# Patient Record
Sex: Male | Born: 1985 | Race: Black or African American | Hispanic: No | Marital: Single | Smoking: Current every day smoker
Health system: Southern US, Community
[De-identification: ages and names within clinical notes are randomized; demographics above are authoritative.]

---

## 2007-12-07 ENCOUNTER — Emergency Department: Payer: Self-pay | Admitting: Emergency Medicine

## 2009-09-25 ENCOUNTER — Emergency Department: Payer: Self-pay | Admitting: Emergency Medicine

## 2010-04-19 ENCOUNTER — Emergency Department: Payer: Self-pay | Admitting: Emergency Medicine

## 2010-04-21 ENCOUNTER — Emergency Department: Payer: Self-pay | Admitting: Emergency Medicine

## 2015-11-30 ENCOUNTER — Emergency Department: Payer: Self-pay

## 2015-11-30 ENCOUNTER — Emergency Department
Admission: EM | Admit: 2015-11-30 | Discharge: 2015-11-30 | Disposition: A | Discharge status: Home / Self Care | Payer: Self-pay | Attending: Emergency Medicine | Admitting: Emergency Medicine

## 2015-11-30 ENCOUNTER — Encounter: Payer: Self-pay | Admitting: Emergency Medicine

## 2015-11-30 DIAGNOSIS — F17200 Nicotine dependence, unspecified, uncomplicated: Secondary | ICD-10-CM | POA: Insufficient documentation

## 2015-11-30 DIAGNOSIS — M5481 Occipital neuralgia: Secondary | ICD-10-CM | POA: Insufficient documentation

## 2015-11-30 DIAGNOSIS — R202 Paresthesia of skin: Secondary | ICD-10-CM

## 2015-11-30 NOTE — ED Provider Notes (Signed)
[facility] Emergency Department Provider Note  Time seen: 2:25 PM  I have reviewed the triage vital signs and the nursing notes.   HISTORY  Chief Complaint Numbness    HPI Alvie HeidelbergGeraldo R Beedle is a 30 y.o. male with no past medical history presents the emergency department with numbness to the back of his right scalp. According to the patient for approximately one week he's had a numbness/decreased sensation to the back of the right scalp. Denies any known injuries. Denies any neck pain. Denies any headache, focal weakness or numbness other than the scalp. Denies any confusion, slurred speech, trouble walking or talking.     History reviewed. No pertinent past medical history.  There are no active problems to display for this patient.   History reviewed. No pertinent past surgical history.  No current outpatient prescriptions on file.  Allergies Review of patient's allergies indicates not on file.  History reviewed. No pertinent family history.  Social History Social History  Substance Use Topics  . Smoking status: Current Every Day Smoker  . Smokeless tobacco: None  . Alcohol Use: Yes     Comment: mostly drinks on weekends ("a lot")    Review of Systems Constitutional: Negative for fever. Cardiovascular: Negative for chest pain. Respiratory: Negative for shortness of breath. Gastrointestinal: Negative for abdominal pain Musculoskeletal: Negative for back pain.Negative for neck pain. Neurological: Negative for . Decreased sensation to right occipital scalp. 10-point ROS otherwise negative.  ____________________________________________   PHYSICAL EXAM:  VITAL SIGNS: ED Triage Vitals  Enc Vitals Group     BP 11/30/15 1130 134/64 mmHg     Pulse Rate 11/30/15 1130 73     Resp 11/30/15 1130 20     Temp 11/30/15 1130 98.3 F (36.8 C)     Temp src --      SpO2 11/30/15 1130 95 %     Weight 11/30/15 1130 195 lb (88.451 kg)     Height  11/30/15 1130 5\' 9"  (1.753 m)     Head Cir --      Peak Flow --      Pain Score --      Pain Loc --      Pain Edu? --      Excl. in GC? --     Constitutional: Alert and oriented. Well appearing and in no distress. Eyes: Normal exam ENT   Head: Normocephalic and atraumatic.   Mouth/Throat: Mucous membranes are moist. Cardiovascular: Normal rate, regular rhythm. No murmur Respiratory: Normal respiratory effort without tachypnea nor retractions. Breath sounds are clear  Gastrointestinal: Soft and nontender. No distention.  Musculoskeletal: Nontender with normal range of motion in all extremities.  Neurologic:  Normal speech and language. Patient states decreased sensation to right occipital scalp, but he can feel me touching him there. Otherwise neurologic exam is normal. 5/5 motor in all extremities, equal grip strengths, no pronator drift. Cranial nerves intact. Skin:  Skin is warm, dry and intact. No rash identified. Psychiatric: Mood and affect are normal.   ____________________________________________     RADIOLOGY  CT head negative   INITIAL IMPRESSION / ASSESSMENT AND PLAN / ED COURSE  Pertinent labs & imaging results that were available during my care of the patient were reviewed by me and considered in my medical decision making (see chart for details).  Patient presents the emergency department with 1 week of right-sided occipital scalp numbness/decreased sensation. The numbness appears to be an occipital nerve distribution. Otherwise normal neurologic exam.  CT scan performed in triage is negative. I discussed with the patient ibuprofen/Motrin as needed as an anti-inflammatory, allowing 2-3 weeks for resolution of this likely occipital neuropathy. Patient agreeable to plan.  ____________________________________________   FINAL CLINICAL IMPRESSION(S) / ED DIAGNOSES  Occipital neuropathy   Minna Antis, MD 11/30/15 1427

## 2015-11-30 NOTE — ED Notes (Signed)
Says numbness on left posterior scalp since ?saturday.  No injury and no headache.  nad

## 2015-11-30 NOTE — ED Notes (Signed)
Pt reports numbness to back of head x 1 week. Denies pain, weakness, altered mental status. Pt alert & oriented with NAD noted.

## 2015-11-30 NOTE — Discharge Instructions (Signed)
Paresthesia Paresthesia is an abnormal burning or prickling sensation. This sensation is generally felt in the hands, arms, legs, or feet. However, it may occur in any part of the body. Usually, it is not painful. The feeling may be described as:  Tingling or numbness.  Pins and needles.  Skin crawling.  Buzzing.  Limbs falling asleep.  Itching. Most people experience temporary (transient) paresthesia at some time in their lives. Paresthesia may occur when you breathe too quickly (hyperventilation). It can also occur without any apparent cause. Commonly, paresthesia occurs when pressure is placed on a nerve. The sensation quickly goes away after the pressure is removed. For some people, however, paresthesia is a long-lasting (chronic) condition that is caused by an underlying disorder. If you continue to have paresthesia, you may need further medical evaluation. HOME CARE INSTRUCTIONS Watch your condition for any changes. Taking the following actions may help to lessen any discomfort that you are feeling:  Avoid drinking alcohol.  Try acupuncture or massage to help relieve your symptoms.  Keep all follow-up visits as directed by your health care provider. This is important. SEEK MEDICAL CARE IF:  You continue to have episodes of paresthesia.  Your burning or prickling feeling gets worse when you walk.  You have pain, cramps, or dizziness.  You develop a rash. SEEK IMMEDIATE MEDICAL CARE IF:  You feel weak.  You have trouble walking or moving.  You have problems with speech, understanding, or vision.  You feel confused.  You cannot control your bladder or bowel movements.  You have numbness after an injury.  You faint.   This information is not intended to replace advice given to you by your health care provider. Make sure you discuss any questions you have with your health care provider.   Document Released: 07/01/2002 Document Revised: 11/25/2014 Document Reviewed:  07/07/2014 Elsevier Interactive Patient Education 2016 Else[employer] Inc[employer]  Occipital Neuralgia Occipital neuralgia is a type of headache that causes episodes of very bad pain in the back of your head. Pain from occipital neuralgia may spread (radiate) to other parts of your head. The pain is usually brief and often goes away after you rest and relax. These headaches may be caused by irritation of the nerves that leave your spinal cord high up in your neck, just below the base of your skull (occipital nerves). Your occipital nerves transmit sensations from the back of your head, the top of your head, and the areas behind your ears. CAUSES Occipital neuralgia can occur without any known cause (primary headache syndrome). In other cases, occipital neuralgia is caused by pressure on or irritation of one of the two occipital nerves. Causes of occipital nerve compression or irritation include:  Wear and tear of the vertebrae in the neck (osteoarthritis).  Neck injury.  Disease of the disks that separate the vertebrae.  Tumors.  Gout.  Infections.  Diabetes.  Swollen blood vessels that put pressure on the occipital nerves.  Muscle spasm in the neck. SIGNS AND SYMPTOMS Pain is the main symptom of occipital neuralgia. It usually starts in the back of the head but may also be felt in other areas supplied by the occipital nerves. Pain is usually on one side but may be on both sides. You may have:   Brief episodes of very bad pain that is burning, stabbing, shocking, or shooting.  Pain behind the eye.  Pain triggered by neck movement or hair brushing.  Scalp tenderness.  Aching in the back of the head  between episodes of very bad pain. DIAGNOSIS  Your health care provider may diagnose occipital neuralgia based on your symptoms and a physical exam. During the exam, the health care provider may push on areas supplied by the occipital nerves to see if they are painful. Some tests may also be done  to help in making the diagnosis. These may include:  Imaging studies of the upper spinal cord, such as an MRI or CT scan. These may show compression or spinal cord abnormalities.  Nerve block. You will get an injection of numbing medicine (local anesthetic) near the occipital nerve to see if this relieves pain. TREATMENT  Treatment may begin with simple measures, such as:   Rest.  Massage.  Heat.  Over-the-counter pain relievers. If these measures do not work, you may need other treatments, including:  Medicines such as:  Prescription-strength anti-inflammatory medicines.  Muscle relaxants.  Antiseizure medicines.  Antidepressants.  Steroid injection. This involves injections of local anesthetic and strong anti-inflammatory drugs (steroids).  Pulsed radiofrequency. Wires are implanted to deliver electrical impulses that block pain signals from the occipital nerve.  Physical therapy.  Surgery to relieve nerve pressure. HOME CARE INSTRUCTIONS  Take all medicines as directed by your health care provider.  Avoid activities that cause pain.  Rest when you have an attack of pain.  Try gentle massage or a heating pad to relieve pain.  Work with a physical therapist to learn stretching exercises you can do at home.  Try a different pillow or sleeping position.  Practice good posture.  Try to stay active. Get regular exercise that does not cause pain. Ask your health care provider to suggest safe exercises for you.  Keep all follow-up visits as directed by your health care provider. This is important. SEEK MEDICAL CARE IF:  Your medicine is not working.  You have new or worsening symptoms. SEEK IMMEDIATE MEDICAL CARE IF:  You have very bad head pain that is not going away.  You have a sudden change in vision, balance, or speech. MAKE SURE YOU:  Understand these instructions.  Will watch your condition.  Will get help right away if you are not doing well or  get worse.   This information is not intended to replace advice given to you by your health care provider. Make sure you discuss any questions you have with your health care provider.   Document Released: 07/05/2001 Document Revised: 08/01/2014 Document Reviewed: 07/03/2013 Elsevier Interactive Patient Education [employer]

## 2016-12-30 ENCOUNTER — Emergency Department
Admission: EM | Admit: 2016-12-30 | Discharge: 2016-12-30 | Disposition: A | Discharge status: Home / Self Care | Payer: Worker's Compensation | Attending: Emergency Medicine | Admitting: Emergency Medicine

## 2016-12-30 ENCOUNTER — Emergency Department: Payer: Worker's Compensation

## 2016-12-30 DIAGNOSIS — Y998 Other external cause status: Secondary | ICD-10-CM | POA: Diagnosis not present

## 2016-12-30 DIAGNOSIS — Y9389 Activity, other specified: Secondary | ICD-10-CM | POA: Insufficient documentation

## 2016-12-30 DIAGNOSIS — S61234A Puncture wound without foreign body of right ring finger without damage to nail, initial encounter: Secondary | ICD-10-CM | POA: Insufficient documentation

## 2016-12-30 DIAGNOSIS — F17200 Nicotine dependence, unspecified, uncomplicated: Secondary | ICD-10-CM | POA: Diagnosis not present

## 2016-12-30 DIAGNOSIS — Y929 Unspecified place or not applicable: Secondary | ICD-10-CM | POA: Diagnosis not present

## 2016-12-30 DIAGNOSIS — W458XXA Other foreign body or object entering through skin, initial encounter: Secondary | ICD-10-CM | POA: Diagnosis not present

## 2016-12-30 DIAGNOSIS — Z23 Encounter for immunization: Secondary | ICD-10-CM | POA: Diagnosis not present

## 2016-12-30 DIAGNOSIS — S60944A Unspecified superficial injury of right ring finger, initial encounter: Secondary | ICD-10-CM | POA: Diagnosis present

## 2016-12-30 MED ORDER — PENTAFLUOROPROP-TETRAFLUOROETH EX AERO
INHALATION_SPRAY | CUTANEOUS | Status: AC
  Administered 2016-12-30: 20:00:00
  Filled 2016-12-30: qty 30

## 2016-12-30 MED ORDER — TETANUS-DIPHTH-ACELL PERTUSSIS 5-2.5-18.5 LF-MCG/0.5 IM SUSP
0.5000 mL | Freq: Once | INTRAMUSCULAR | Status: AC
  Administered 2016-12-30: 0.5 mL via INTRAMUSCULAR
  Filled 2016-12-30: qty 0.5

## 2016-12-30 NOTE — ED Provider Notes (Signed)
[facility] Emergency Department Provider Note  ____________________________________________  Time seen: Approximately 7:24 PM  I have reviewed the triage vital signs and the nursing notes.   HISTORY  Chief Complaint Finger Injury and Foreign Body in Skin   HPI Roy Foster is a 31 y.o. male who presents to the emergency department for evaluation of what he believes to be a splinter that is in his right hand, ring finger. He states that he was loading some equipment and pushed a log out of the way and immediately felt something stick into his finger. He has been unable to remove anything that feels take their is a splinter under the skin. He also reports that his tetanus shot is not up-to-date  History reviewed. No pertinent past medical history.  There are no active problems to display for this patient.   History reviewed. No pertinent surgical history.  Prior to Admission medications   Not on File    Allergies Patient has no known allergies.  No family history on file.  Social History Social History  Substance Use Topics  . Smoking status: Current Every Day Smoker  . Smokeless tobacco: Never Used  . Alcohol use Yes     Comment: mostly drinks on weekends ("a lot")    Review of Systems  Constitutional: Well appearing.  Respiratory: Negative for cough or shortness of breath  Musculoskeletal: Positive for ring finger pain on the right and  Skin: Puncture wound noted of the right hand, ring finger. Neurological: Negative for loss of sensation or paresthesias of the right hand. ____________________________________________   PHYSICAL EXAM:  VITAL SIGNS: ED Triage Vitals  Enc Vitals Group     BP 12/30/16 1919 123/75     Pulse Rate 12/30/16 1919 71     Resp 12/30/16 1919 16     Temp 12/30/16 1919 98.4 F (36.9 C)     Temp Source 12/30/16 1919 Oral     SpO2 12/30/16 1919 96 %     Weight 12/30/16 1915 210 lb (95.3 kg)     Height  12/30/16 1915 5\' 9"  (1.753 m)     Head Circumference --      Peak Flow --      Pain Score 12/30/16 1914 3     Pain Loc --      Pain Edu? --      Excl. in GC? --      Constitutional: Well appearing. Cardiovascular: Capillary refill is less than 3 seconds. Respiratory: Respirations are even and unlabored. Musculoskeletal: Full, active range of motion of the right hand, specifically the right ring finger. Neurologic: Motor and sensation intact Skin:  Puncture wound present to the finger pad of the right ring finger  ____________________________________________   LABS (all labs ordered are listed, but only abnormal results are displayed)  Labs Reviewed - No data to display ____________________________________________  EKG   ____________________________________________  RADIOLOGY  No retained foreign body or acute bony abnormality of the right ring finger per radiology. ____________________________________________   PROCEDURES  Procedure(s) performed: None ____________________________________________   INITIAL IMPRESSION / ASSESSMENT AND PLAN / ED COURSE  Roy Foster is a 31 y.o. male who presents to the emergency department for evaluation and treatment of possible splinter in the right ring finger. X-ray showing no radiopaque foreign body. Patient was encouraged to keep the wound clean and dry and apply antibiotic ointment on there twice a day. He is instructed to follow-up with his primary care provider for symptoms  of concern or return to the emergency department if he is unable schedule appointment.   Pertinent labs & imaging results that were available during my care of the patient were reviewed by me and considered in my medical decision making (see chart for details). ____________________________________________   FINAL CLINICAL IMPRESSION(S) / ED DIAGNOSES  Final diagnoses:  Puncture wound of right ring finger    There are no discharge medications for  this patient.   If controlled substance prescribed during this visit, 12 month history viewed on the [facility] prior to issuing an initial prescription for Schedule II or III opiod.   Note:  This document was prepared using Dragon voice recognition software and may include unintentional dictation errors.    Chinita Pesterriplett, Marcelle Bebout B, FNP 12/30/16 2348    Emily FilbertWilliams, Jonathan E, MD 12/30/16 [id number]

## 2016-12-30 NOTE — ED Notes (Signed)
Pt states he has a splinter stuck in right ring finger - the area is swollen and causing pain

## 2016-12-30 NOTE — Discharge Instructions (Signed)
Apply antibiotic ointment to the wound 2 times per day. Keep the wound clean and dry until healed. Follow up with the primary care provider of your choice for symptoms that are not improving over the next few days. Return to the ER for symptoms that change or worsen if you  are unable to schedule an appointment.

## 2016-12-30 NOTE — ED Triage Notes (Signed)
Pt presents to ED with c/o foreign body in the RIGHT ring finger. Pt was working with wood and got a splinter; no bleeding at this time. Splinter appears to have gone straight in, small dark area noted on the pad of the affected finger. Pt reports WC case, 1st Nurse is contacting employer d/t no information in profile database.

## 2016-12-30 NOTE — ED Notes (Signed)
Pt. Going home with family. 

## 2016-12-30 NOTE — ED Notes (Signed)
Pt wants to file w/c, pt reports employer [employer], no profile on file.  Pt has email from HR rep handling his WC case, rep called, no answer, VM left.  Contact Info Rebeca AllegraJessica McIlveen [phone]

## 2018-04-29 ENCOUNTER — Other Ambulatory Visit
Admission: RE | Admit: 2018-04-29 | Discharge: 2018-04-29 | Disposition: A | Discharge status: Home / Self Care | Payer: Self-pay | Attending: Family Medicine | Admitting: Family Medicine

## 2018-04-29 NOTE — ED Notes (Signed)
Patient to ED with [facility] for forensic blood draw.  [employer] has search warrant.  Specimens obtained x 2 from right antecub after area was cleansed with betadine.  Patient tolerated well.  Specimens given to [employer].

## 2018-04-29 NOTE — ED Notes (Signed)
In to triage 1 for forensic blood draw for [employer].  At this time patient is refusing "I don't do needle".  No blood specimens obtained.

## 2021-02-24 ENCOUNTER — Other Ambulatory Visit: Payer: Self-pay

## 2021-02-24 ENCOUNTER — Emergency Department: Payer: Self-pay

## 2021-02-24 ENCOUNTER — Emergency Department
Admission: EM | Admit: 2021-02-24 | Discharge: 2021-02-25 | Disposition: A | Discharge status: Home / Self Care | Payer: Self-pay | Attending: Emergency Medicine | Admitting: Emergency Medicine

## 2021-02-24 DIAGNOSIS — R0602 Shortness of breath: Secondary | ICD-10-CM | POA: Insufficient documentation

## 2021-02-24 DIAGNOSIS — X58XXXA Exposure to other specified factors, initial encounter: Secondary | ICD-10-CM | POA: Insufficient documentation

## 2021-02-24 DIAGNOSIS — R739 Hyperglycemia, unspecified: Secondary | ICD-10-CM

## 2021-02-24 DIAGNOSIS — R079 Chest pain, unspecified: Secondary | ICD-10-CM

## 2021-02-24 DIAGNOSIS — Y9367 Activity, basketball: Secondary | ICD-10-CM | POA: Insufficient documentation

## 2021-02-24 DIAGNOSIS — S82891A Other fracture of right lower leg, initial encounter for closed fracture: Secondary | ICD-10-CM

## 2021-02-24 DIAGNOSIS — F17200 Nicotine dependence, unspecified, uncomplicated: Secondary | ICD-10-CM | POA: Insufficient documentation

## 2021-02-24 DIAGNOSIS — R7309 Other abnormal glucose: Secondary | ICD-10-CM | POA: Insufficient documentation

## 2021-02-24 LAB — BASIC METABOLIC PANEL
Anion gap: 10 (ref 5–15)
BUN: 11 mg/dL (ref 6–20)
CO2: 23 mmol/L (ref 22–32)
Calcium: 9.4 mg/dL (ref 8.9–10.3)
Chloride: 104 mmol/L (ref 98–111)
Creatinine, Ser: 1.09 mg/dL (ref 0.61–1.24)
GFR, Estimated: >60 mL/min (ref >60)
Glucose, Bld: 126 mg/dL — ABNORMAL HIGH (ref 70–99)
Potassium: 3.6 mmol/L (ref 3.5–5.1)
Sodium: 137 mmol/L (ref 135–145)

## 2021-02-24 LAB — CBC
HCT: 48.7 % (ref 39.0–52.0)
Hemoglobin: 16.9 g/dL (ref 13.0–17.0)
MCH: 31.5 pg (ref 26.0–34.0)
MCHC: 34.7 g/dL (ref 30.0–36.0)
MCV: 90.7 fL (ref 80.0–100.0)
Platelets: 292 10*3/uL (ref 150–400)
RBC: 5.37 MIL/uL (ref 4.22–5.81)
RDW: 13.5 % (ref 11.5–15.5)
WBC: 6.6 10*3/uL (ref 4.0–10.5)
nRBC: 0 % (ref 0.0–0.2)

## 2021-02-24 LAB — TROPONIN I (HIGH SENSITIVITY)
Troponin I (High Sensitivity): 3 ng/L (ref <18)
Troponin I (High Sensitivity): 3 ng/L (ref <18)

## 2021-02-24 NOTE — ED Triage Notes (Signed)
Pt to ED for left sided cp x1 week. Reports chronic intermittent shob. Denies n/v Also report pain to right ankle after injuring it playing basketball a couple weeks ago and hearing a pop Pt in NAD RR even and unlabored

## 2021-02-24 NOTE — ED Provider Notes (Signed)
Emergency Medicine Provider Triage Evaluation Note  Roy Foster , a 35 y.o. male  was evaluated in triage.  Pt complains of chest pain x 7 days that has been intermittent. Estimates pain is present twice per day. Activity does not trigger pain. Occasional relief with 600mg -800mg  of ibuprofen.   Also complains of right ankle pain x 2 weeks after landing awkwardly. No relief with RICE.  Review of Systems  Positive: Chest pain and shortness of breath, positive for right ankle pain. Negative: Fever, cough.  Physical Exam  BP (!) 147/78 (BP Location: Left Arm)   Pulse 85   Temp 98.2 F (36.8 C) (Oral)   Resp 18   Ht 5\' 9"  (1.753 m)   Wt 114.8 kg   SpO2 98%   BMI 37.36 kg/m  Gen:   Awake, no distress   Resp:  Normal effort  MSK:   Right ankle diffusely tender with diffuse mild edema. Other:   Medical Decision Making  Medically screening exam initiated at 3:55 PM.  Appropriate orders placed.  Roy Foster was informed that the remainder of the evaluation will be completed by another provider, this initial triage assessment does not replace that evaluation, and the importance of remaining in the ED until their evaluation is complete.   , FNP 02/24/21 1745    Alvie Heidelberg, MD 02/24/21 [phone]

## 2021-02-25 LAB — D-DIMER, QUANTITATIVE: D-Dimer, Quant: 0.44 ug/mL-FEU (ref 0.00–0.50)

## 2021-02-25 NOTE — Discharge Instructions (Addendum)
As we discussed, there is a small avulsion fracture of your ankle.  However since this is several weeks old there is no interventions at this time.  If you continue to have issues follow-up with [facility] across the street.  Your blood glucose was slightly elevated therefore it is important they follow-up with primary care doctor for repeat fasting blood glucose.  I am also referring you to cardiology for your chest pain.  Return to the emergency room if you have new or worsening chest pain or shortness of breath.  You were seen for chest pain. Your workup today was reassuring. As I explained to you that does not mean that you do not have heart disease. You may need further evaluation to ensure you do not have a serious heart problem. Therefore it is imperative that you follow up with your doctor in 1-2 days for further evaluation.   When should you call for help?  Call 911 if: You passed out (lost consciousness) or if you feel dizzy. You have difficulty breathing. You have symptoms of a heart attack. These may include: Chest pain or pressure, or a strange feeling in your chest. Indigestion. Sweating. Shortness of breath. Nausea or vomiting. Pain, pressure, or a strange feeling in your back, neck, jaw, or upper belly or in one or both shoulders or arms. Lightheadedness or sudden weakness. A fast or irregular heartbeat. After you call 911, the operator may tell you to chew 1 adult-strength or 2 to 4 low-dose aspirin. Wait for an ambulance. Do not try to drive yourself.   Call your doctor today if: You have any trouble breathing. Your chest pain gets worse. You are dizzy or lightheaded, or you feel like you may faint. You are not getting better as expected. You are having new or different chest pain  How can you care for yourself at home? Rest until you feel better. Take your medicine exactly as prescribed. Call your doctor if you think you are having a problem with your medicine. Do  not drive after taking a prescription pain medicine.

## 2021-02-25 NOTE — ED Provider Notes (Signed)
[facility] Emergency Department Provider Note  ____________________________________________  Time seen: Approximately 12:26 AM  I have reviewed the triage vital signs and the nursing notes.   HISTORY  Chief Complaint Chest Pain and Ankle Pain   HPI Roy Foster is a 35 y.o. male who presents for evaluation of chest pain, shortness of breath, and right ankle pain.  Patient reports chest pain and shortness of breath have been intermittent for over a year now.  They come and go independently of each other.  They can come at rest and with exertion.  He reports over the last 2 days the chest pain has become more pronounced.  He describes it as a sharp pain on the left side of his chest lasting seconds at a time.  He reports feeling short of breath with ambulation.  He is a smoker.  He denies any personal or family history.  DVT, recent travel immobilization, leg pain or swelling, hemoptysis, exogenous hormones.  He is a smoker.  Denies any family history of heart disease.  He denies any cough, wheezing or fever.  Patient is also complaining of 3 to 4 weeks of right ankle pain.  He reports that he was playing basketball when he heard a pop and since then has had reduced range of motion and pain in the medial aspect of the right ankle.  He has not seen anyone for this pain until now.  History reviewed. No pertinent past medical history.  There are no problems to display for this patient.   No past surgical history on file.  Prior to Admission medications   Not on File    Allergies Patient has no known allergies.  FH Heart disease - no DVT/PE - no  Social History Social History   Tobacco Use   Smoking status: Every Day   Smokeless tobacco: Never  Substance Use Topics   Alcohol use: Yes    Comment: mostly drinks on weekends ("a lot")    Review of Systems  Constitutional: Negative for fever. Eyes: Negative for visual changes. ENT: Negative for  sore throat. Neck: No neck pain  Cardiovascular: + chest pain. Respiratory: + shortness of breath. Gastrointestinal: Negative for abdominal pain, vomiting or diarrhea. Genitourinary: Negative for dysuria. Musculoskeletal: Negative for back pain. + R ankle pain Skin: Negative for rash. Neurological: Negative for headaches, weakness or numbness. Psych: No SI or HI  ____________________________________________   PHYSICAL EXAM:  VITAL SIGNS: ED Triage Vitals  Enc Vitals Group     BP 02/24/21 1553 (!) 147/78     Pulse Rate 02/24/21 1553 85     Resp 02/24/21 1553 18     Temp 02/24/21 1553 98.2 F (36.8 C)     Temp Source 02/24/21 1553 Oral     SpO2 02/24/21 1553 98 %     Weight 02/24/21 1554 253 lb (114.8 kg)     Height 02/24/21 1554 5\' 9"  (1.753 m)     Head Circumference --      Peak Flow --      Pain Score 02/24/21 1553 3     Pain Loc --      Pain Edu? --      Excl. in GC? --     Constitutional: Alert and oriented. Well appearing and in no apparent distress. HEENT:      Head: Normocephalic and atraumatic.         Eyes: Conjunctivae are normal. Sclera is non-icteric.  Mouth/Throat: Mucous membranes are moist.       Neck: Supple with no signs of meningismus. Cardiovascular: Regular rate and rhythm. No murmurs, gallops, or rubs. 2+ symmetrical distal pulses are present in all extremities. No JVD. Respiratory: Normal respiratory effort. Lungs are clear to auscultation bilaterally.  Gastrointestinal: Soft, non tender, and non distended with positive bowel sounds. No rebound or guarding. Musculoskeletal:  No edema, cyanosis, or erythema of extremities.  Minimal tenderness to the right medial malleolus with full intact and painless range of motion of the ankle, no erythema or swelling Neurologic: Normal speech and language. Face is symmetric. Moving all extremities. No gross focal neurologic deficits are appreciated. Skin: Skin is warm, dry and intact. No rash  noted. Psychiatric: Mood and affect are normal. Speech and behavior are normal.  ____________________________________________   LABS (all labs ordered are listed, but only abnormal results are displayed)  Labs Reviewed  BASIC METABOLIC PANEL - Abnormal; Notable for the following components:      Result Value   Glucose, Bld 126 (*)    All other components within normal limits  CBC  D-DIMER, QUANTITATIVE  TROPONIN I (HIGH SENSITIVITY)  TROPONIN I (HIGH SENSITIVITY)   ____________________________________________  EKG  ED ECG REPORT I, Nita Sickle, the attending physician, personally viewed and interpreted this ECG.  Normal sinus rhythm, rate of 87, normal intervals, normal axis, no ST elevations or depressions, T wave inversions in inferior leads.  No significant changes when compared to prior ____________________________________________  RADIOLOGY  I have personally reviewed the images performed during this visit and I agree with the Radiologist's read.   Interpretation by Radiologist:  DG Chest 2 View  Result Date: 02/24/2021 CLINICAL DATA:  Left-sided chest pain for 1 week EXAM: CHEST - 2 VIEW COMPARISON:  None. FINDINGS: The heart size and mediastinal contours are within normal limits. Both lungs are clear. The visualized skeletal structures are unremarkable. IMPRESSION: Normal chest radiographs. Electronically Signed   By: Duanne Guess D.O.   On: 02/24/2021 16:52   DG Ankle Complete Right  Result Date: 02/24/2021 CLINICAL DATA:  Right ankle pain after basketball injury several weeks ago EXAM: RIGHT ANKLE - COMPLETE 3+ VIEW COMPARISON:  None. FINDINGS: Frontal, oblique, and lateral views of the right ankle are obtained. Small ossific density adjacent to the tip of the medial malleolus is seen on the frontal view, which could be related to a small avulsion fracture. There is mild diffuse soft tissue swelling of the ankle. No other acute bony abnormalities. Joint spaces  are well preserved. IMPRESSION: 1. Small ossific density at the tip of the medial malleolus, which could reflect a small avulsion fracture. 2. Mild diffuse soft tissue swelling. Electronically Signed   By: Sharlet Salina M.D.   On: 02/24/2021 16:53     ____________________________________________   PROCEDURES  Procedure(s) performed:yes .1-3 Lead EKG Interpretation  Date/Time: 02/25/2021 1:00 AM Performed by: Nita Sickle, MD Authorized by: Nita Sickle, MD     Interpretation: non-specific     ECG rate assessment: normal     Rhythm: sinus rhythm     Ectopy: none     Conduction: normal     Critical Care performed:  None ____________________________________________   INITIAL IMPRESSION / ASSESSMENT AND PLAN / ED COURSE  35 y.o. male who presents for evaluation of chest pain, shortness of breath, and right ankle pain.  Patient is well-appearing in no distress with normal vital signs, normal work of breathing normal sats, lungs are clear to auscultation,  strong pulses in all 4 extremities, no asymmetric leg swelling.  There is mild tenderness to palpation of the right medial malleolus with no swelling or bruising.  CP/SOB: Sounds atypical for ACS.  Patient has EKG that is nonischemic with 2 negative high-sensitivity troponins.  No current chest pain at this time.  Patient is slightly obese, smoker, and found to have slightly elevated random blood glucose therefore has enough risk factors for possible heart disease.  Will refer to cardiologist for further evaluation.  D-dimer was done ruling out PE which was low suspicion to begin with in the setting of no tachypnea, tachycardia, or hypoxia and intermittent symptoms.  Chest x-ray visualized by me with no signs of edema, pneumonia, pneumothorax.  No signs of anemia.  Ankle pain: Patient sustained injury 3 to 4 weeks ago and continues to have persistent pain.  He has an x-ray here that was visualized by me showing a possible  avulsion fracture off the medial malleolus which is where patient is hurting.  Since were 3 to 4 weeks out there is not much we can do in the emergency department.  He was given a Aircast and crutches and referred to [facility] for further evaluation.  I discussed my standard return precautions with patient and his wife was at bedside.       _____________________________________________ Please note:  Patient was evaluated in Emergency Department today for the symptoms described in the history of present illness. Patient was evaluated in the context of the global COVID-19 pandemic, which necessitated consideration that the patient might be at risk for infection with the SARS-CoV-2 virus that causes COVID-19. Institutional protocols and algorithms that pertain to the evaluation of patients at risk for COVID-19 are in a state of rapid change based on information released by regulatory bodies including the CDC and federal and state organizations. These policies and algorithms were followed during the patient's care in the ED.  Some ED evaluations and interventions may be delayed as a result of limited staffing during the pandemic.   [place] Controlled Substance Database was reviewed by me. ____________________________________________   FINAL CLINICAL IMPRESSION(S) / ED DIAGNOSES   Final diagnoses:  Chest pain, unspecified type  Elevated random blood glucose level  Closed avulsion fracture of right ankle, initial encounter      NEW MEDICATIONS STARTED DURING THIS VISIT:  ED Discharge Orders          Ordered    Ambulatory referral to Cardiology        02/25/21 0028             Note:  This document was prepared using Dragon voice recognition software and may include unintentional dictation errors.    Don Perking, [place], MD 02/25/21 [phone]

## 2022-07-07 IMAGING — CR DG ANKLE COMPLETE 3+V*R*
1 series · 3 of 3 positions shown · non-contrast
Comparison: None.

CLINICAL DATA: Right ankle pain after basketball injury several
weeks ago

EXAM:
RIGHT ANKLE - COMPLETE 3+ VIEW

[Series 1: dg ankle complete right · 0.14mm/px · 3 of 3 slices shown]
[im 1/3]
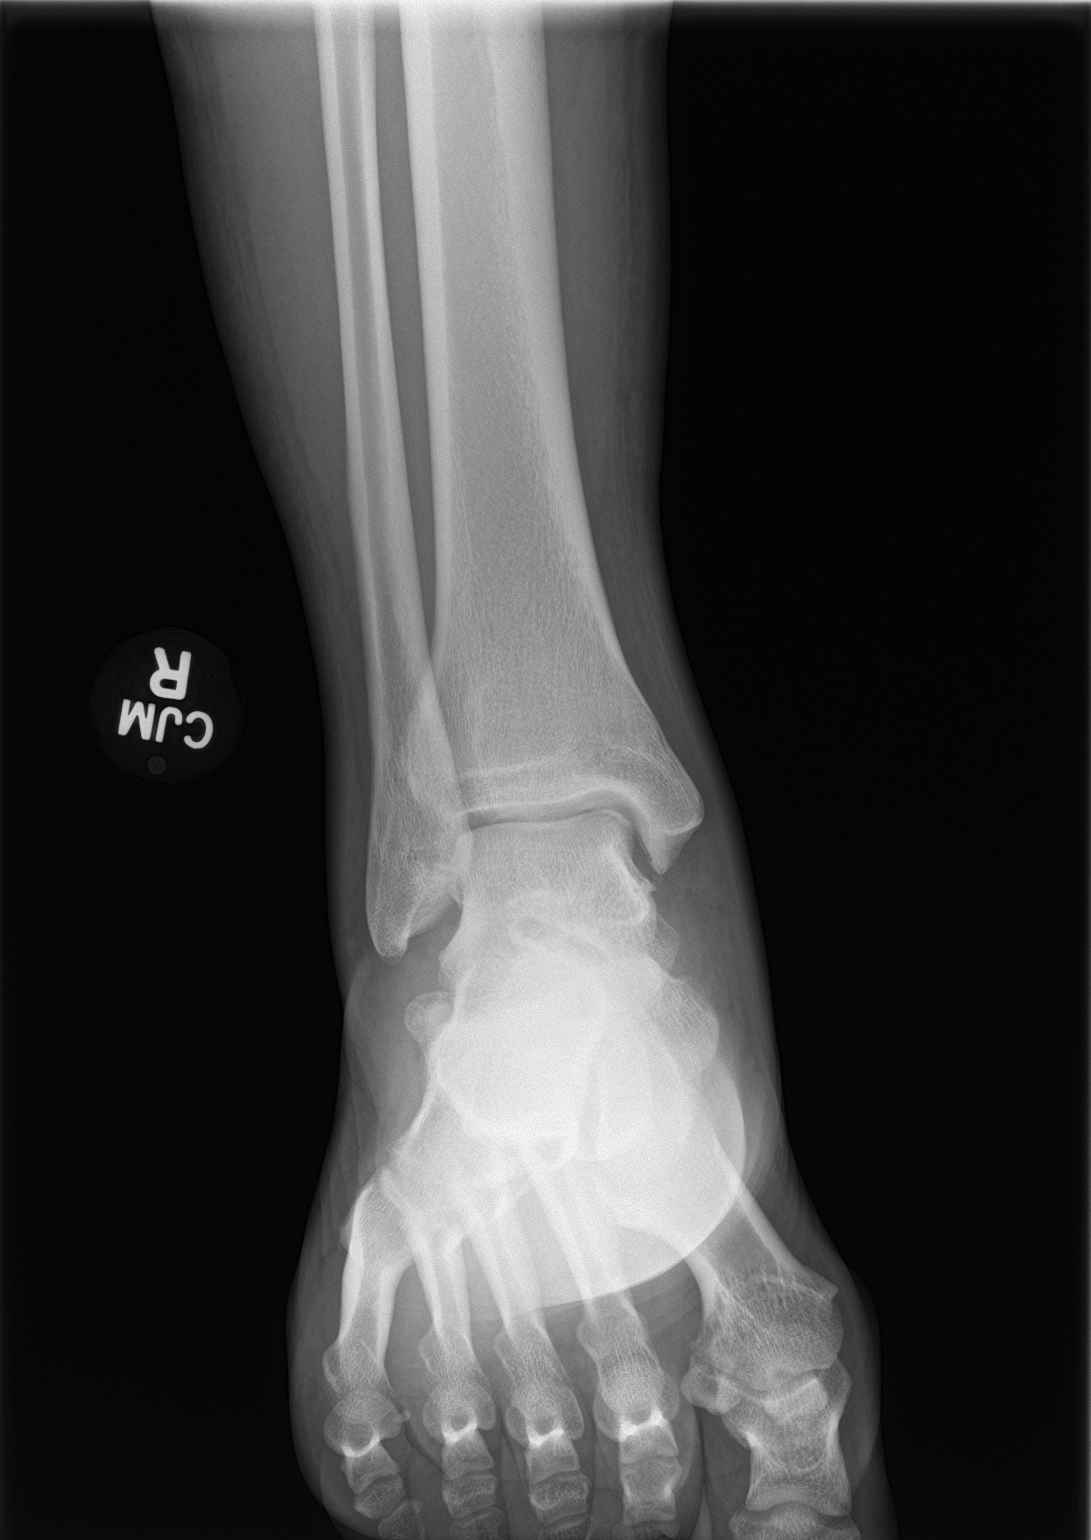
[im 2/3]
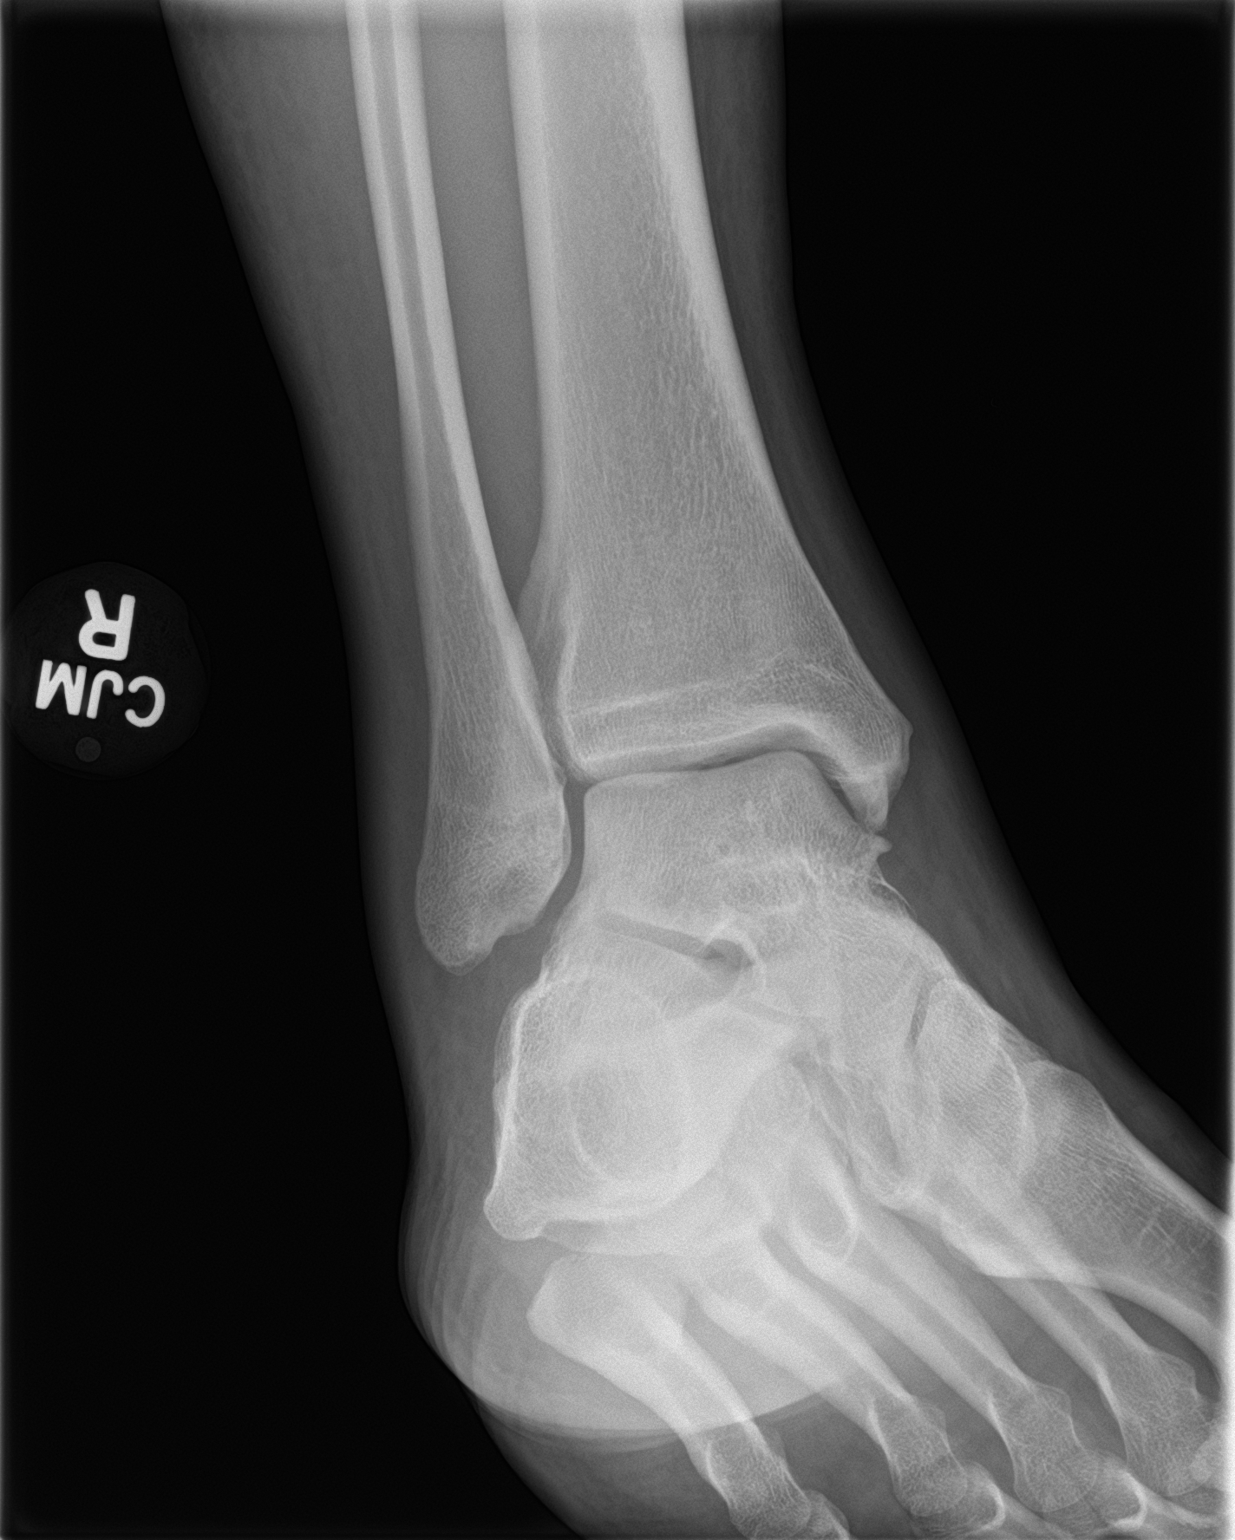
[im 3/3]
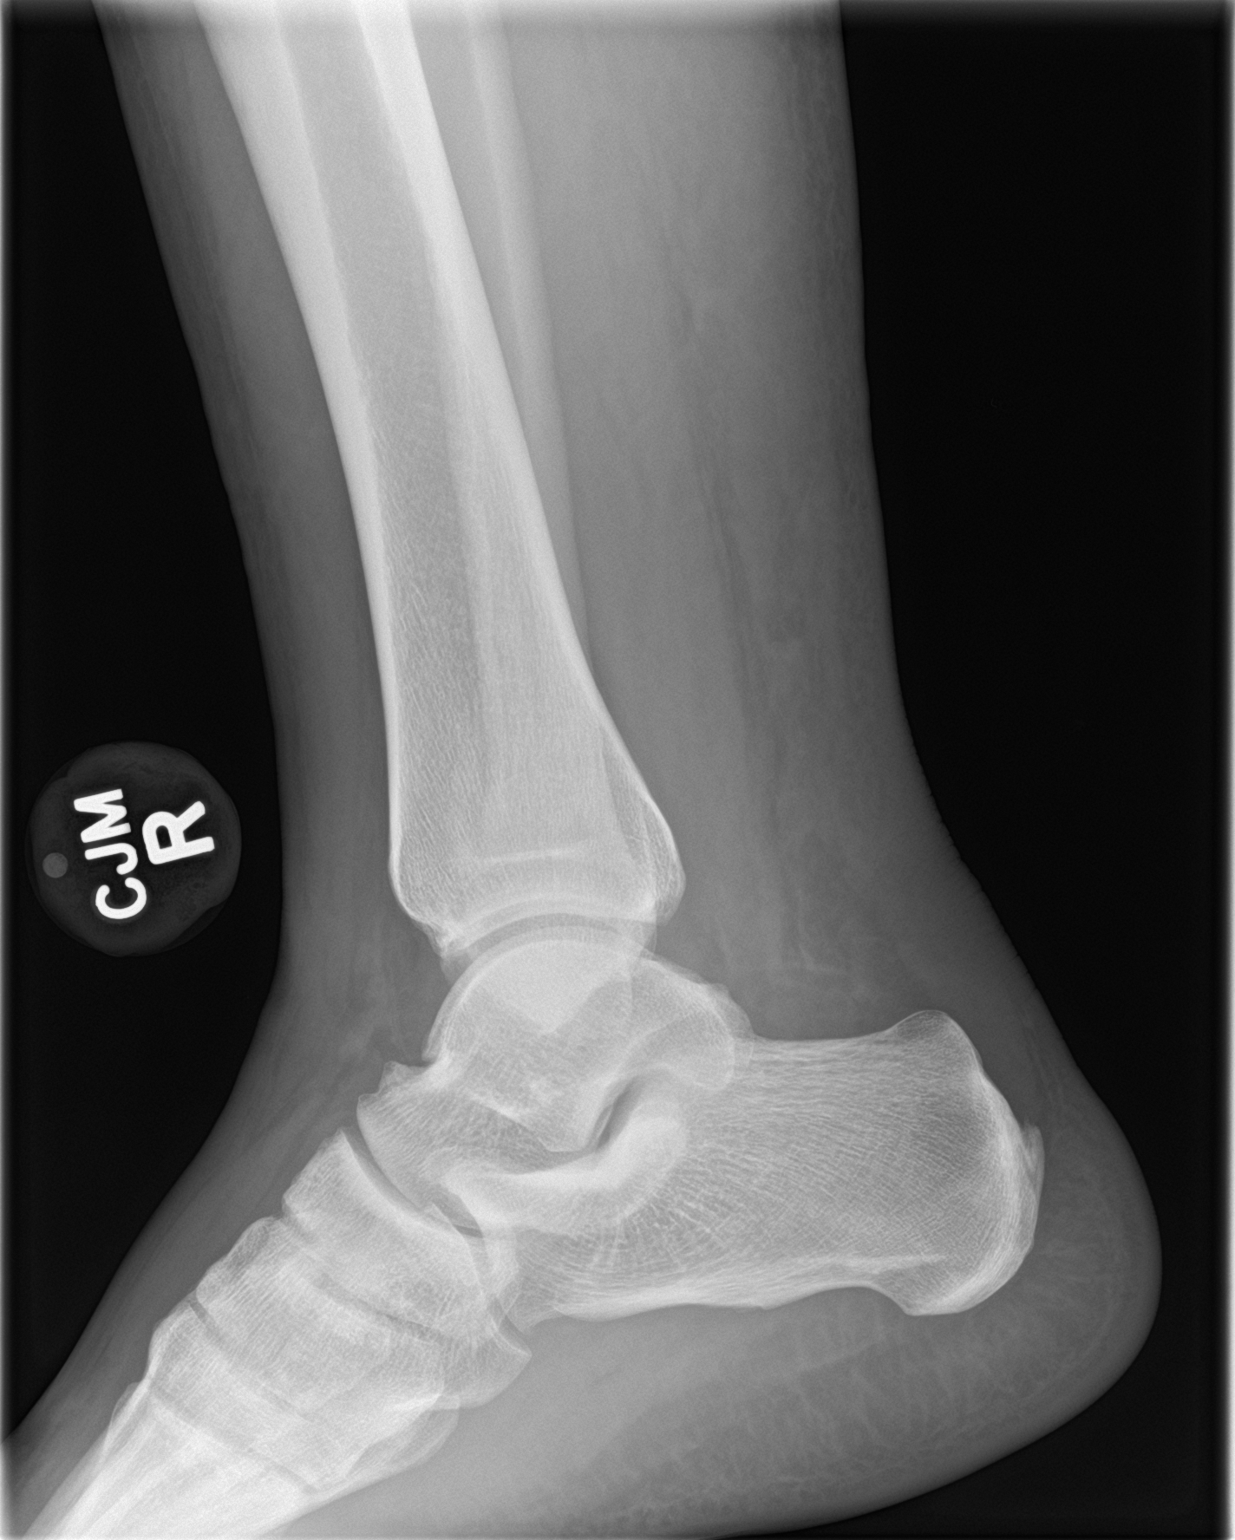

[3 of 3 positions shown; findings below may reference images not displayed]

FINDINGS: Frontal, oblique, and lateral views of the right ankle are obtained.
Small ossific density adjacent to the tip of the medial malleolus is
seen on the frontal view, which could be related to a small avulsion
fracture. There is mild diffuse soft tissue swelling of the ankle.
No other acute bony abnormalities. Joint spaces are well preserved.
IMPRESSION: 1. Small ossific density at the tip of the medial malleolus, which
could reflect a small avulsion fracture.
2. Mild diffuse soft tissue swelling.
